# Patient Record
Sex: Male | Born: 1995 | Race: White | Hispanic: No | Marital: Married | Smoking: Never smoker
Health system: Southern US, Community
[De-identification: ages and names within clinical notes are randomized; demographics above are authoritative.]

## PROBLEM LIST (undated history)

## (undated) HISTORY — PX: TONSILLECTOMY: SUR1361

## (undated) HISTORY — PX: WISDOM TOOTH EXTRACTION: SHX21

---
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
## 2016-11-13 IMAGING — CR DG TIBIA/FIBULA 2V*R*
2 series · 2 of 2 positions shown · non-contrast
Comparison: None.

CLINICAL DATA: Pain along the lateral aspect the right tibia for 1
week.

EXAM:
RIGHT TIBIA AND FIBULA - 2 VIEW

[x tib-fib ap right]
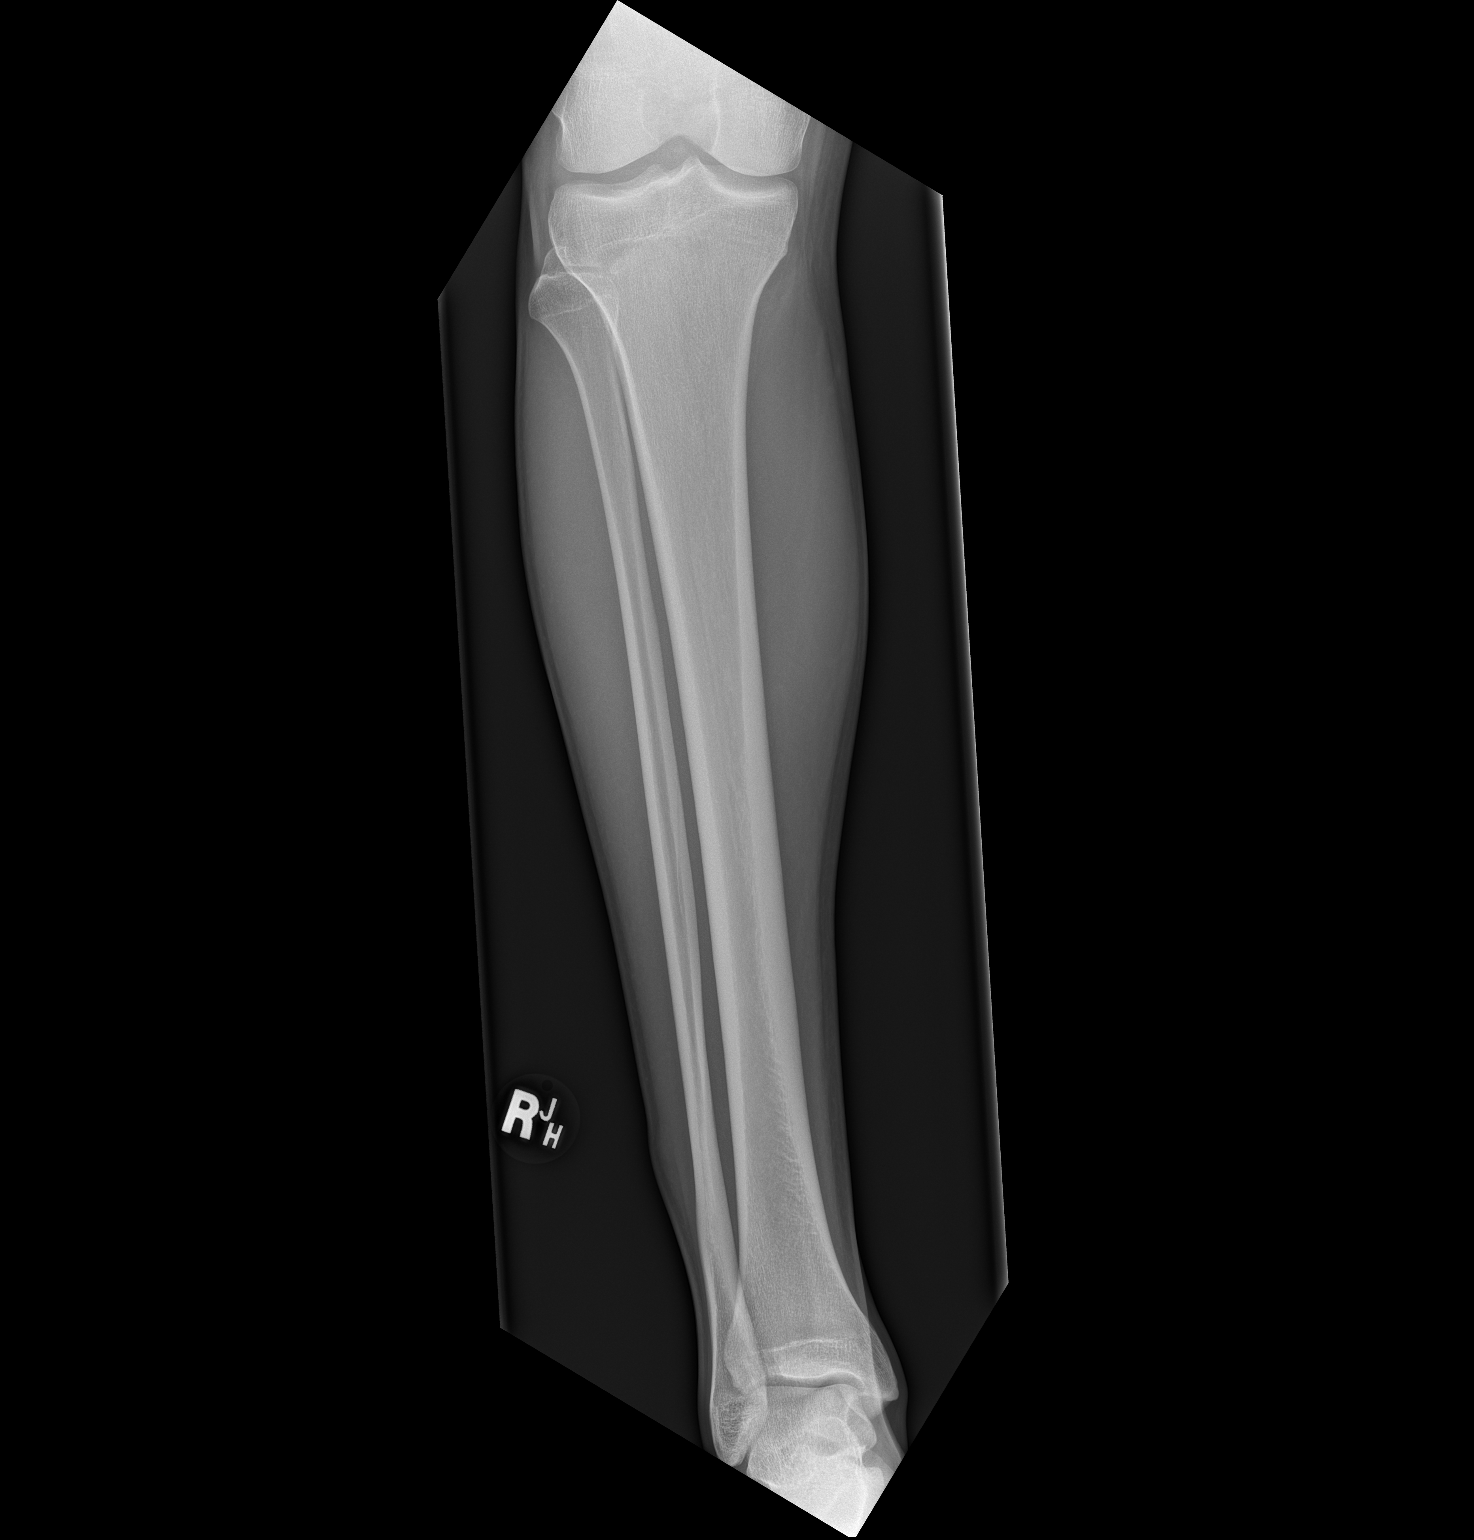

[x tib-fib lat right]
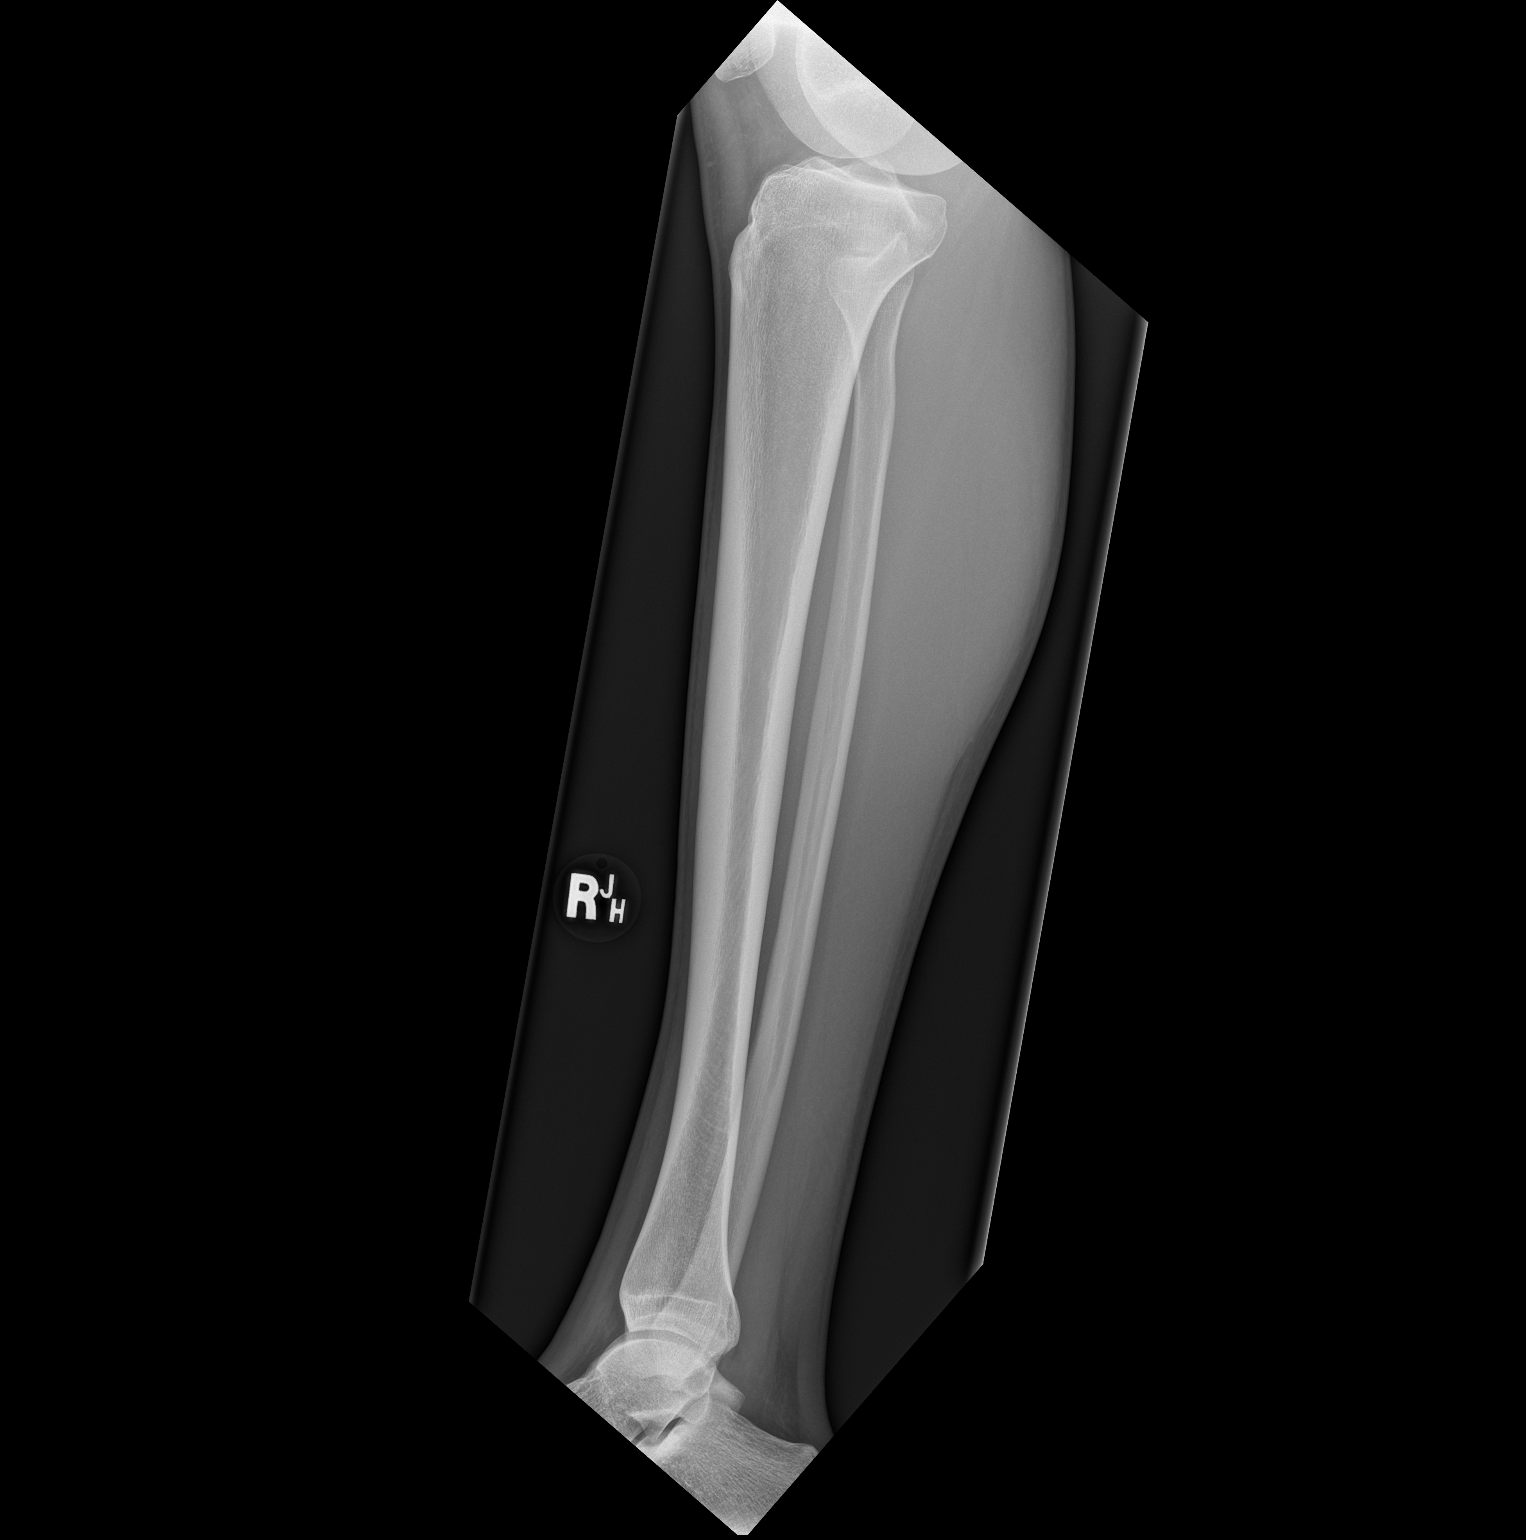

[2 of 2 positions shown; findings below may reference images not displayed]

FINDINGS: There is no evidence of fracture or other focal bone lesions. Soft
tissues are unremarkable.
IMPRESSION: Negative.

## 2017-08-03 ENCOUNTER — Ambulatory Visit (INDEPENDENT_AMBULATORY_CARE_PROVIDER_SITE_OTHER): Payer: Managed Care, Other (non HMO) | Admitting: Allergy and Immunology

## 2017-08-03 ENCOUNTER — Encounter: Payer: Self-pay | Admitting: Allergy and Immunology

## 2017-08-03 VITALS — BP 106/70 | HR 60 | Temp 98.3°F | Ht 74.0 in | Wt 153.0 lb

## 2017-08-03 DIAGNOSIS — T7800XA Anaphylactic reaction due to unspecified food, initial encounter: Secondary | ICD-10-CM | POA: Insufficient documentation

## 2017-08-03 DIAGNOSIS — T7800XD Anaphylactic reaction due to unspecified food, subsequent encounter: Secondary | ICD-10-CM

## 2017-08-03 DIAGNOSIS — L500 Allergic urticaria: Secondary | ICD-10-CM

## 2017-08-03 MED ORDER — EPINEPHRINE 0.3 MG/0.3ML IJ SOAJ: INTRAMUSCULAR | 1 refills | Status: AC

## 2017-08-03 NOTE — Assessment & Plan Note (Signed)
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  Meticulous avoidance of shellfish as discussed.  A prescription has been provided for epinephrine auto-injector 2 pack along with instructions for proper administration.  A food allergy action plan has been provided and discussed.  Medic Alert identification is recommended.

## 2017-08-03 NOTE — Progress Notes (Signed)
New Patient Note  RE: Jeff Elliott MRN: [id number] DOB: 1995-08-21 Date of Office Visit: 08/03/2017  Referring provider: No ref. provider found Primary care provider: Darrow Bussing, MD  Chief Complaint: Allergic Reaction (shrimp) and Allergy Testing   History of present illness: Jeff Elliott is a 22 y.o. male presenting today for evaluation of food allergy.  He states that when he was 22 years old he consumed shrimp and developed generalized urticaria.  He was evaluated by an allergist and was reactive on skin test to all shellfish with the exception of oyster.  He has avoided shellfish since that time.  His epinephrine autoinjector has expired.  He is here today to reassess his food allergy status.  Assessment and plan: Food allergy Positive skin test results today confirm persistence of food allergy.  Meticulous avoidance of shellfish as discussed.  A prescription has been provided for epinephrine auto-injector 2 pack along with instructions for proper administration.  A food allergy action plan has been provided and discussed.  Medic Alert identification is recommended.   Meds ordered this encounter  Medications  . EPINEPHrine (AUVI-Q) 0.3 mg/0.3 mL IJ SOAJ injection    Sig: Use as directed for severe allergic reaction.    Dispense:  2 Device    Refill:  1    Diagnostics: Food allergen skin testing: Positive to shrimp, crab, lobster, and oyster.    Physical examination: Blood pressure 106/70, pulse 60, temperature 98.3 F (36.8 C), temperature source Oral, height 6\' 2"  (1.88 m), weight 153 lb (69.4 kg), SpO2 97 %.  General: Alert, interactive, in no acute distress. Neck: Supple without lymphadenopathy. Lungs: Clear to auscultation without wheezing, rhonchi or rales. CV: Normal S1, S2 without murmurs. Abdomen: Nondistended, nontender. Skin: Warm and dry, without lesions or rashes. Extremities:  No clubbing, cyanosis or edema. Neuro:   Grossly intact.  Review of  systems:  Review of systems negative except as noted in HPI / PMHx or noted below: Review of Systems  Constitutional: Negative.   HENT: Negative.   Eyes: Negative.   Respiratory: Negative.   Cardiovascular: Negative.   Gastrointestinal: Negative.   Genitourinary: Negative.   Musculoskeletal: Negative.   Skin: Negative.   Neurological: Negative.   Endo/Heme/Allergies: Negative.   Psychiatric/Behavioral: Negative.     Past medical history:  History reviewed. No pertinent past medical history.  Past surgical history:  Past Surgical History:  Procedure Laterality Date  . TONSILLECTOMY     at age 2    Family history: Family History  Problem Relation Age of Onset  . Eczema Mother   . Allergic rhinitis Father   . Eczema Cousin   . Asthma Neg Hx   . Urticaria Neg Hx   . Immunodeficiency Neg Hx   . Angioedema Neg Hx     Social history: Social History   Socioeconomic History  . Marital status: Single    Spouse name: Not on file  . Number of children: Not on file  . Years of education: Not on file  . Highest education level: Not on file  Occupational History  . Not on file  Social Needs  . Financial resource strain: Not on file  . Food insecurity:    Worry: Not on file    Inability: Not on file  . Transportation needs:    Medical: Not on file    Non-medical: Not on file  Tobacco Use  . Smoking status: Never Smoker  . Smokeless tobacco: Never Used  Substance and Sexual  Activity  . Alcohol use: No  . Drug use: No  . Sexual activity: Not on file  Lifestyle  . Physical activity:    Days per week: Not on file    Minutes per session: Not on file  . Stress: Not on file  Relationships  . Social connections:    Talks on phone: Not on file    Gets together: Not on file    Attends religious service: Not on file    Active member of club or organization: Not on file    Attends meetings of clubs or organizations: Not on file    Relationship status: Not on file  .  Intimate partner violence:    Fear of current or ex partner: Not on file    Emotionally abused: Not on file    Physically abused: Not on file    Forced sexual activity: Not on file  Other Topics Concern  . Not on file  Social History Narrative  . Not on file   Environmental History: Patient lives in an apartment with carpeting in the bedroom and central air/heat.  There is a dog in the home which has access to his bedroom.  He is a non-smoker.  There is no known mold/water damage in the apartment.  Allergies as of 08/03/2017      Reactions   Shellfish Allergy Hives      Medication List        Accurate as of 08/03/17  2:54 PM. Always use your most recent med list.          EPINEPHrine 0.3 mg/0.3 mL Soaj injection Commonly known as:  AUVI-Q Use as directed for severe allergic reaction.       Known medication allergies: Allergies  Allergen Reactions  . Shellfish Allergy Hives    I appreciate the opportunity to take part in [place]'s care. Please do not hesitate to contact me with questions.  Sincerely,   R. Jorene Guestarter Drayk Humbarger, MD

## 2017-08-03 NOTE — Patient Instructions (Addendum)
Food allergy Positive skin test results today confirm persistence of food allergy.  Meticulous avoidance of shellfish as discussed.  A prescription has been provided for epinephrine auto-injector 2 pack along with instructions for proper administration.  A food allergy action plan has been provided and discussed.  Medic Alert identification is recommended.

## 2017-08-15 ENCOUNTER — Telehealth: Payer: Self-pay

## 2017-08-15 NOTE — Telephone Encounter (Signed)
Pt. Calling to let us know he has not received his auvi q. For his shellfish allergy. I called auvi q representative and they have tried the pt's number with no response. Spoke to Beckwour[place]olna and she will call the pt. Again to verify pt.'s address. I will also call the pt. To follow up the call from [facility]. I did call the pt. And he did receive a call from [facility]. Pt. Should receive his auvi q this wk. Told pt. To call us back if he hasn't received his auvi q

## 2022-07-09 ENCOUNTER — Ambulatory Visit (INDEPENDENT_AMBULATORY_CARE_PROVIDER_SITE_OTHER): Payer: No Typology Code available for payment source | Admitting: Family Medicine

## 2022-07-09 ENCOUNTER — Encounter: Payer: Self-pay | Admitting: Family Medicine

## 2022-07-09 VITALS — BP 104/67 | HR 78 | Temp 98.3°F | Resp 18 | Ht 74.0 in | Wt 192.4 lb

## 2022-07-09 DIAGNOSIS — S20462A Insect bite (nonvenomous) of left back wall of thorax, initial encounter: Secondary | ICD-10-CM | POA: Diagnosis not present

## 2022-07-09 DIAGNOSIS — W57XXXA Bitten or stung by nonvenomous insect and other nonvenomous arthropods, initial encounter: Secondary | ICD-10-CM | POA: Insufficient documentation

## 2022-07-09 DIAGNOSIS — R1084 Generalized abdominal pain: Secondary | ICD-10-CM | POA: Diagnosis not present

## 2022-07-09 DIAGNOSIS — L03319 Cellulitis of trunk, unspecified: Secondary | ICD-10-CM

## 2022-07-09 DIAGNOSIS — L02219 Cutaneous abscess of trunk, unspecified: Secondary | ICD-10-CM | POA: Insufficient documentation

## 2022-07-09 DIAGNOSIS — Z7689 Persons encountering health services in other specified circumstances: Secondary | ICD-10-CM | POA: Insufficient documentation

## 2022-07-09 DIAGNOSIS — K2900 Acute gastritis without bleeding: Secondary | ICD-10-CM | POA: Insufficient documentation

## 2022-07-09 MED ORDER — DOXYCYCLINE HYCLATE 100 MG PO TABS: 100.0000 mg | ORAL_TABLET | Freq: Two times a day (BID) | ORAL | 0 refills | Status: AC

## 2022-07-09 NOTE — Progress Notes (Signed)
New Patient Office Visit  Subjective    Patient ID: Jeff Elliott, male    DOB: 11/15/1995  Age: 27 y.o. MRN: [id number]  CC:  Chief Complaint  Patient presents with   Establish Care    Patient is here to establish care with new provider, Patient states that he was bitten by a tick on Monday on left side of his back, (pt has picture of tick) , He states that since then his symptoms have been joint and muscle pain in arms an legs , nausea, diarrhea, and no appetite , shortness of breath, and headache.    HPI Jedrick Broden presents to establish care with this practice. He is new to me. Reports he found a tick on left side of back on Monday night. Works outside. Checks for ticks daily.  Believes tick attached for 10 hours.  Denies fever or rash. Legs have been "killing me" the last 2 days. Shoulders are "bothering" me since last night.   Abdominal pain/nausea: present for 2 days, pain has not gotten better, not significantly worse. Endorses nausea. No fever or chills, no vomiting. Low appetite. 5-6 loose stools since yesterday. Able to keep fluids down.    Outpatient Encounter Medications as of 07/09/2022  Medication Sig   doxycycline (VIBRA-TABS) 100 MG tablet Take 1 tablet (100 mg total) by mouth 2 (two) times daily.   EPINEPHrine (AUVI-Q) 0.3 mg/0.3 mL IJ SOAJ injection Use as directed for severe allergic reaction.   No facility-administered encounter medications on file as of 07/09/2022.    History reviewed. No pertinent past medical history.  Past Surgical History:  Procedure Laterality Date   TONSILLECTOMY     at age 19   WISDOM TOOTH EXTRACTION      Family History  Problem Relation Age of Onset   Eczema Mother    Allergic rhinitis Father    Eczema Cousin    Asthma Neg Hx    Urticaria Neg Hx    Immunodeficiency Neg Hx    Angioedema Neg Hx     Social History   Socioeconomic History   Marital status: Single    Spouse name: Not on file   Number of children: Not on file    Years of education: Not on file   Highest education level: Not on file  Occupational History   Not on file  Tobacco Use   Smoking status: Never    Passive exposure: Never   Smokeless tobacco: Never  Vaping Use   Vaping Use: Every day  Substance and Sexual Activity   Alcohol use: Yes    Comment: occ   Drug use: Never   Sexual activity: Not on file  Other Topics Concern   Not on file  Social History Narrative   Not on file   Social Determinants of Health   Financial Resource Strain: Not on file  Food Insecurity: Not on file  Transportation Needs: Not on file  Physical Activity: Not on file  Stress: Not on file  Social Connections: Not on file  Intimate Partner Violence: Not on file    Review of Systems  Constitutional:  Negative for chills and fever.  Respiratory:  Positive for shortness of breath (started today while walking to court house, this is new).   Gastrointestinal:  Positive for abdominal pain (right side.), diarrhea (5-6 stools since yesterday, runny and soft. No dietary changes.) and nausea. Negative for blood in stool, melena and vomiting.  Genitourinary:  Negative for dysuria.  Musculoskeletal:  Positive  for joint pain and myalgias.  Skin:  Negative for itching and rash.       Area of tick bite on left side of back.   Neurological:  Positive for headaches (started yesterday). Negative for dizziness.        Objective    BP 104/67   Pulse 78   Temp 98.3 F (36.8 C) (Oral)   Resp 18   Ht 6\' 2"  (1.88 m)   Wt 192 lb 6.4 oz (87.3 kg)   SpO2 99%   BMI 24.70 kg/m   Physical Exam Vitals and nursing note reviewed.  Constitutional:      General: He is not in acute distress.    Appearance: Normal appearance.  Cardiovascular:     Rate and Rhythm: Normal rate and regular rhythm.     Heart sounds: Normal heart sounds.  Pulmonary:     Effort: Pulmonary effort is normal.     Breath sounds: Normal breath sounds.  Abdominal:     General: Bowel sounds are  normal.     Palpations: Abdomen is soft.     Tenderness: There is abdominal tenderness. There is no guarding or rebound.  Skin:    General: Skin is warm and dry.     Findings: Erythema present.          Comments: Area of warmth and erythema at tick bite site. No bulls eye rash.  Neurological:     General: No focal deficit present.     Mental Status: He is alert. Mental status is at baseline.  Psychiatric:        Mood and Affect: Mood normal.        Behavior: Behavior normal.        Thought Content: Thought content normal.        Judgment: Judgment normal.         Assessment & Plan:   Problem List Items Addressed This Visit     Establishing care with new doctor, encounter for - Primary   Cellulitis and abscess of trunk   Relevant Medications   doxycycline (VIBRA-TABS) 100 MG tablet   Generalized abdominal pain   Relevant Orders   CBC with Differential/Platelet   Lipase   Tick bite of left back wall of thorax   Relevant Medications   doxycycline (VIBRA-TABS) 100 MG tablet  Low suspicion of tick borne illness due to short length of attachment, no bulls eye rash, no rash in general, no fever. Area with localized infection/cellulitis. Will treat with doxycycline BID for 10 days. Take antibiotic with food and full glass of water.  Discussed possible GI symptoms from antibiotic use.  Abdominal pain and nausea, likely related to another cause. Symptoms discussed that warrant seeking higher level of care. Labs today to assess gallbladder and signs of infectious process. He will let me know if his symptoms do not improve and follow-up early next week. Consider ultrasound at that time.  Agrees with plan of care discussed.  Questions answered.   Return if symptoms worsen or fail to improve.   Novella Olive, FNP

## 2022-07-09 NOTE — Patient Instructions (Addendum)
You were prescribed an antibiotic today. Take with food and full glass of water.  Please complete the full course.  Acetaminophen or ibuprofen for fever according to package instructions, do not exceed recommended doses.  Adequate fluids to avoid dehydration. Lots of rest while you are recovering.  Follow-up if your symptoms do not improve.    If  your abdominal pain worsens or localizes to right lower abdomen, go to the ED immediately.   If you are not better on Monday, schedule appointment and we will get ultrasound.

## 2022-07-10 LAB — CBC WITH DIFFERENTIAL/PLATELET
Basophils Absolute: 0.1 x10E3/uL (ref 0.0–0.2)
Basos: 2 %
EOS (ABSOLUTE): 0.2 x10E3/uL (ref 0.0–0.4)
Eos: 4 %
Hematocrit: 46.1 % (ref 37.5–51.0)
Hemoglobin: 15.1 g/dL (ref 13.0–17.7)
Immature Grans (Abs): 0 x10E3/uL (ref 0.0–0.1)
Immature Granulocytes: 0 %
Lymphocytes Absolute: 1.3 x10E3/uL (ref 0.7–3.1)
Lymphs: 30 %
MCH: 29 pg (ref 26.6–33.0)
MCHC: 32.8 g/dL (ref 31.5–35.7)
MCV: 89 fL (ref 79–97)
Monocytes Absolute: 0.6 x10E3/uL (ref 0.1–0.9)
Monocytes: 13 %
Neutrophils Absolute: 2.2 x10E3/uL (ref 1.4–7.0)
Neutrophils: 51 %
Platelets: 192 x10E3/uL (ref 150–450)
RBC: 5.21 x10E6/uL (ref 4.14–5.80)
RDW: 13.1 % (ref 11.6–15.4)
WBC: 4.3 x10E3/uL (ref 3.4–10.8)

## 2022-07-10 LAB — LIPASE: Lipase: 23 U/L (ref 13–78)

## 2022-07-20 ENCOUNTER — Telehealth: Payer: Self-pay

## 2022-07-20 NOTE — Telephone Encounter (Signed)
Spoke with patient and advised him of lab results and message from Provider. Patient expressed a verbal understanding
